# Patient Record
Sex: Male | Born: 1960 | Race: White | Hispanic: No | Marital: Single
Health system: Southern US, Community
[De-identification: ages and names within clinical notes are randomized; demographics above are authoritative.]

---

## 2005-12-02 ENCOUNTER — Emergency Department (HOSPITAL_COMMUNITY): Admission: EM | Admit: 2005-12-02 | Discharge: 2005-12-03 | Payer: Self-pay | Admitting: Emergency Medicine

## 2007-07-11 ENCOUNTER — Ambulatory Visit: Payer: Self-pay

## 2007-08-14 ENCOUNTER — Other Ambulatory Visit: Payer: Self-pay

## 2007-08-14 ENCOUNTER — Ambulatory Visit: Payer: Self-pay | Admitting: Urology

## 2008-02-01 ENCOUNTER — Emergency Department: Payer: Self-pay | Admitting: Emergency Medicine

## 2008-03-13 ENCOUNTER — Emergency Department: Payer: Self-pay | Admitting: Emergency Medicine

## 2008-03-17 ENCOUNTER — Inpatient Hospital Stay: Payer: Self-pay | Admitting: Internal Medicine

## 2008-03-30 ENCOUNTER — Emergency Department: Payer: Self-pay | Admitting: Emergency Medicine

## 2008-04-12 ENCOUNTER — Emergency Department: Payer: Self-pay | Admitting: Emergency Medicine

## 2008-05-12 ENCOUNTER — Ambulatory Visit: Payer: Self-pay

## 2008-05-13 ENCOUNTER — Emergency Department: Payer: Self-pay | Admitting: Emergency Medicine

## 2008-10-27 ENCOUNTER — Ambulatory Visit: Payer: Self-pay

## 2010-04-23 ENCOUNTER — Ambulatory Visit: Payer: Self-pay | Admitting: Internal Medicine

## 2010-05-10 ENCOUNTER — Inpatient Hospital Stay: Payer: Self-pay | Admitting: Internal Medicine

## 2010-05-24 ENCOUNTER — Ambulatory Visit: Payer: Self-pay | Admitting: Internal Medicine

## 2010-11-07 ENCOUNTER — Other Ambulatory Visit: Payer: Self-pay | Admitting: Geriatric Medicine

## 2010-12-05 ENCOUNTER — Other Ambulatory Visit: Payer: Self-pay | Admitting: Geriatric Medicine

## 2010-12-21 ENCOUNTER — Inpatient Hospital Stay: Payer: Self-pay | Admitting: Internal Medicine

## 2011-02-14 ENCOUNTER — Other Ambulatory Visit: Payer: Self-pay | Admitting: Geriatric Medicine

## 2011-12-24 IMAGING — CT CT HEAD WITHOUT CONTRAST
3 of 4 series · 17 of 37 positions shown, 19 images · non-contrast
Comparison: none

REASON FOR EXAM: ams
COMMENTS:   May transport without cardiac monitor

PROCEDURE:     CT  - CT HEAD WITHOUT CONTRAST  - December 21, 2010  [DATE]
RESULT:     Head CT dated 12/21/2010.
Study was compared to previous study dated 03/13/2008.
TECHNIQUE: Helical noncontrasted 5 mm sections were obtained from skull base
to the vertex.

[Series 3: soft tissue · axial · 0.47mm/px · z∈[-218,-98]mm · 6 of 34 slices shown, 8 images]
[im 5/34  brain]
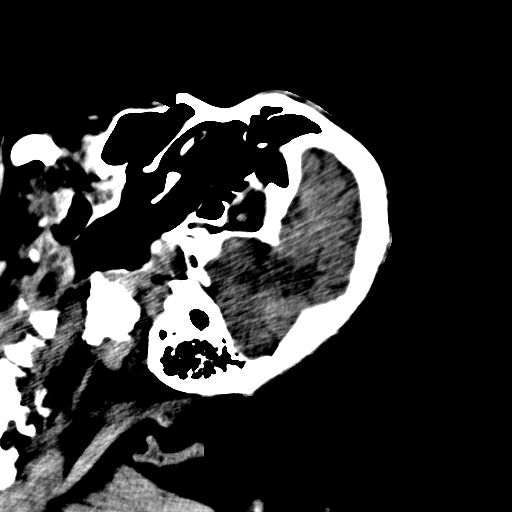
[im 5/34  bone]
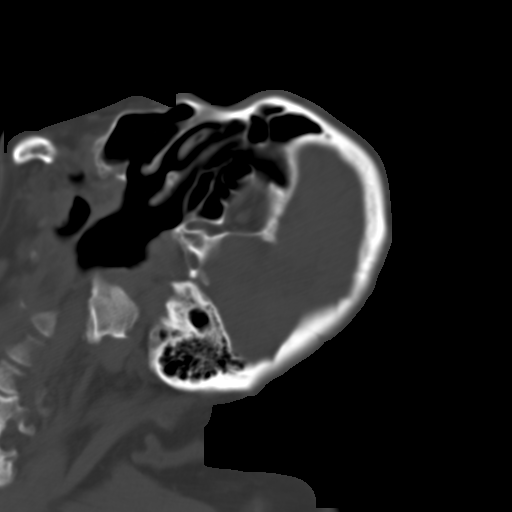
[im 10/34  brain]
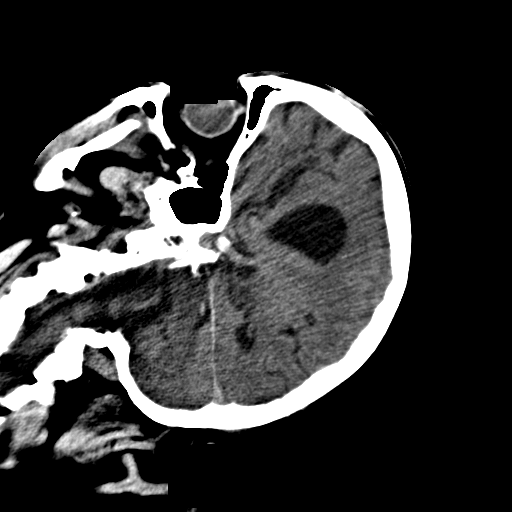
[im 15/34  brain]
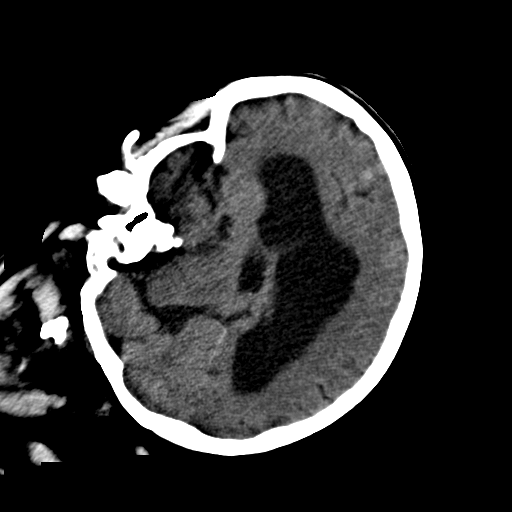
[im 19/34  brain]
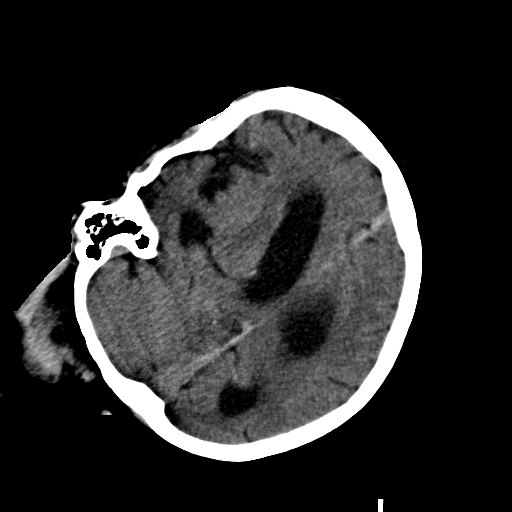
[im 24/34  brain]
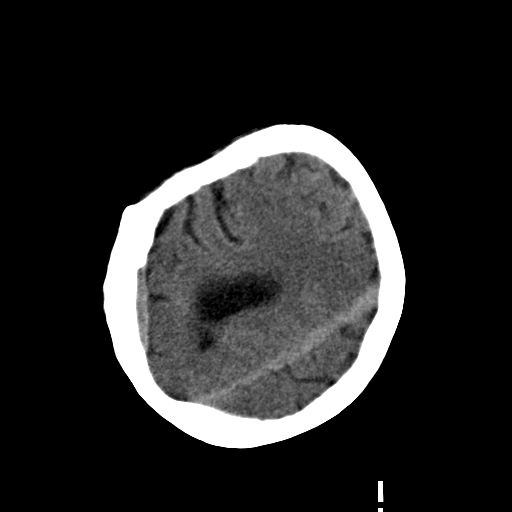
[im 24/34  bone]
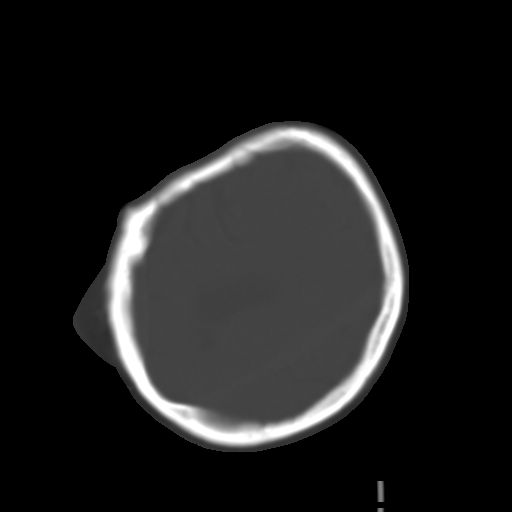
[im 29/34  brain]
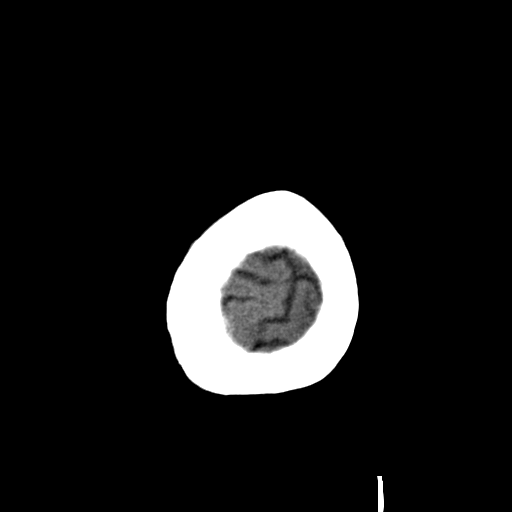

[Series 10: bone recon thin · axial · 0.47mm/px · z∈[-232,-112]mm · 8 of 100 slices shown]
[im 10/100  bone]
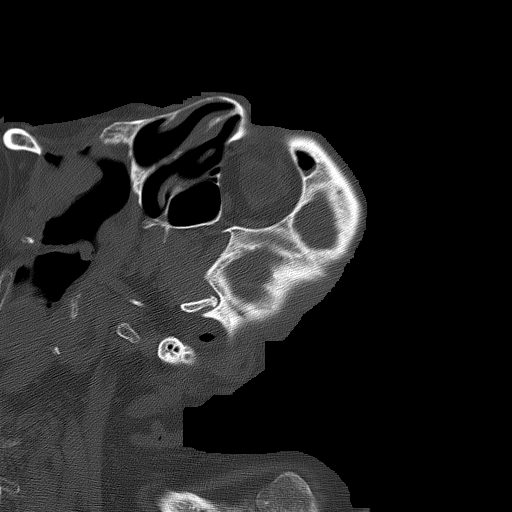
[im 19/100  bone]
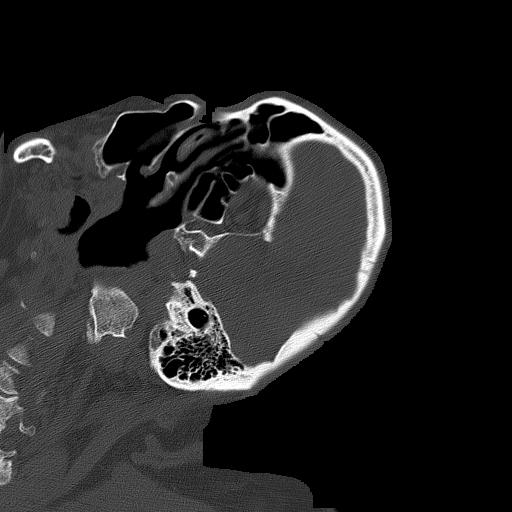
[im 34/100  bone]
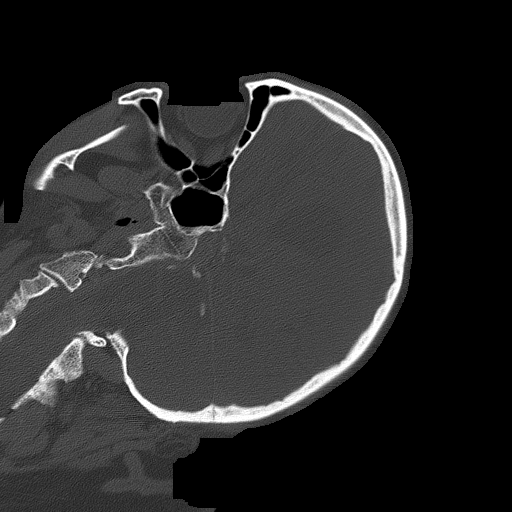
[im 43/100  bone]
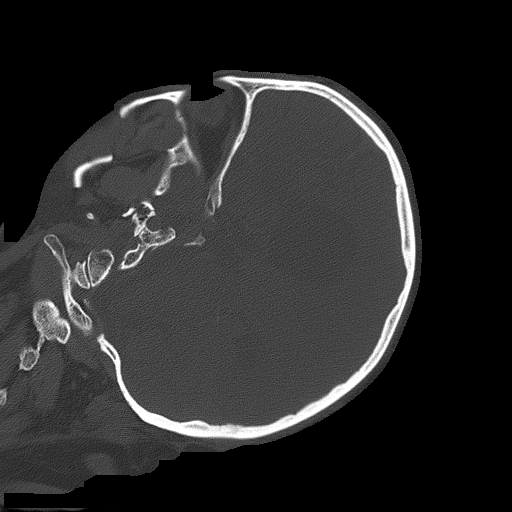
[im 57/100  bone]
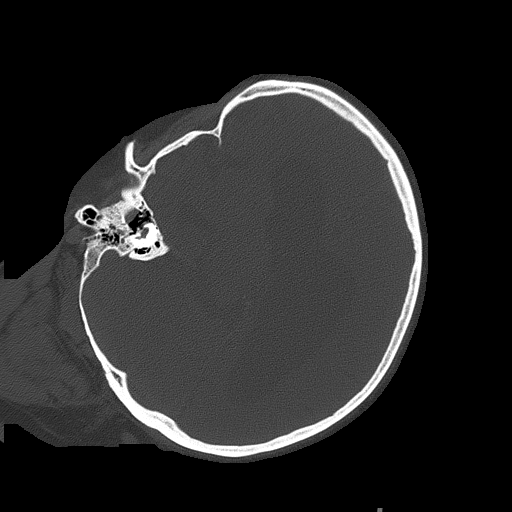
[im 67/100  bone]
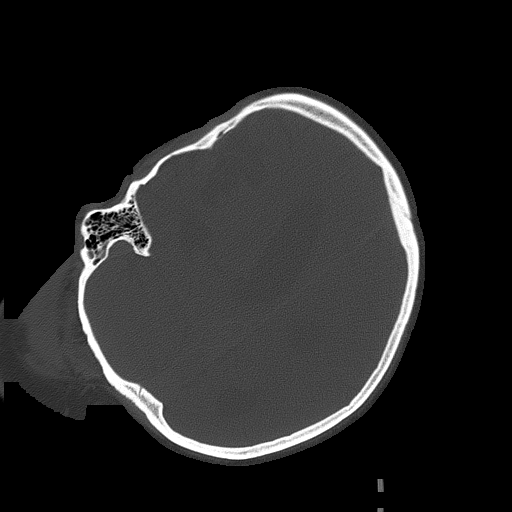
[im 81/100  bone]
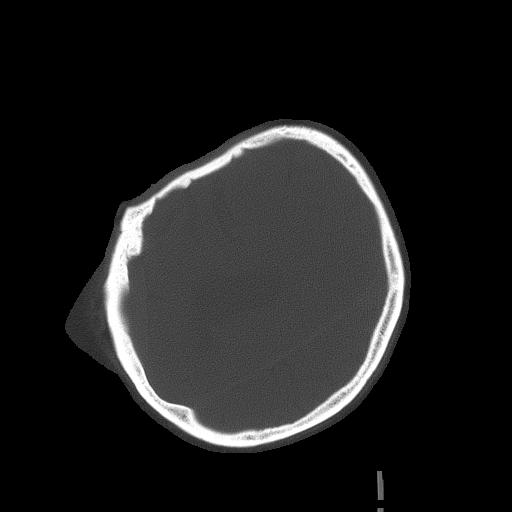
[im 90/100  bone]
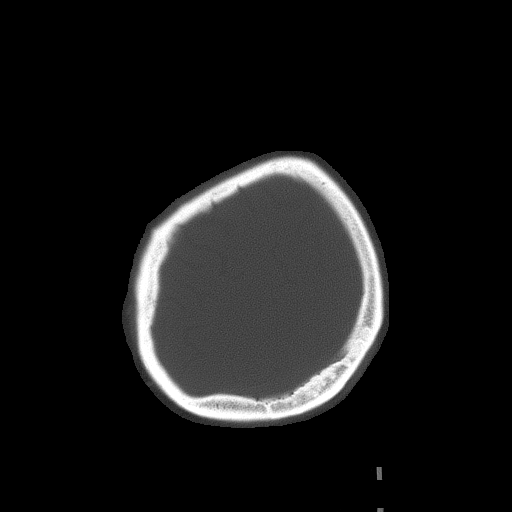

[Series 603: axial recon from thins · sagittal · 0.47mm/px · 3 of 45 slices shown]
[im 20/45  brain]
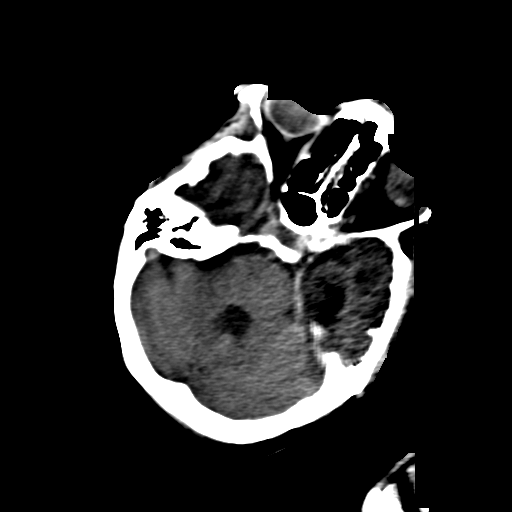
[im 22/45  brain]
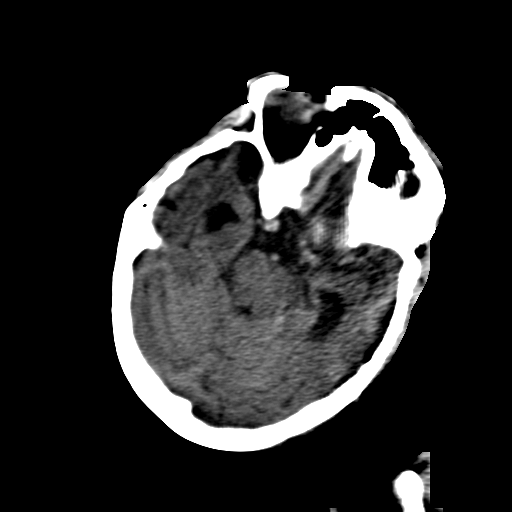
[im 25/45  brain]
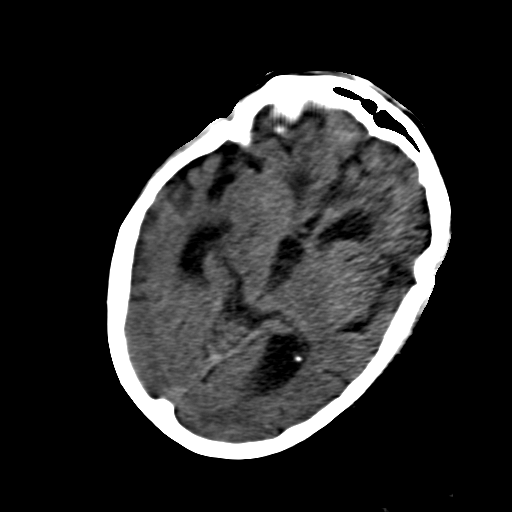

[17 of 37 positions shown; findings below may reference images not displayed]

FINDINGS: There is no evidence of intra-axial nor extra-axial fluid
collections nor evidence of acute hemorrhage.

There is no evidence of subfalcine or tonsillar herniation. Ventricles and
cisterns are patent. There is no evidence of depressed skull fracture. Mild
areas of low attenuation project within the subcortical, deep, and
periventricular white matter regions. Stable ventricular prominence is
identified.
IMPRESSION: Chronic changes without evidence of acute abnormalities.

## 2011-12-24 IMAGING — CR DG CHEST 1V PORT
1 series · 1 of 1 positions shown · non-contrast
Comparison: none

REASON FOR EXAM: ams
COMMENTS:

[view not recorded]
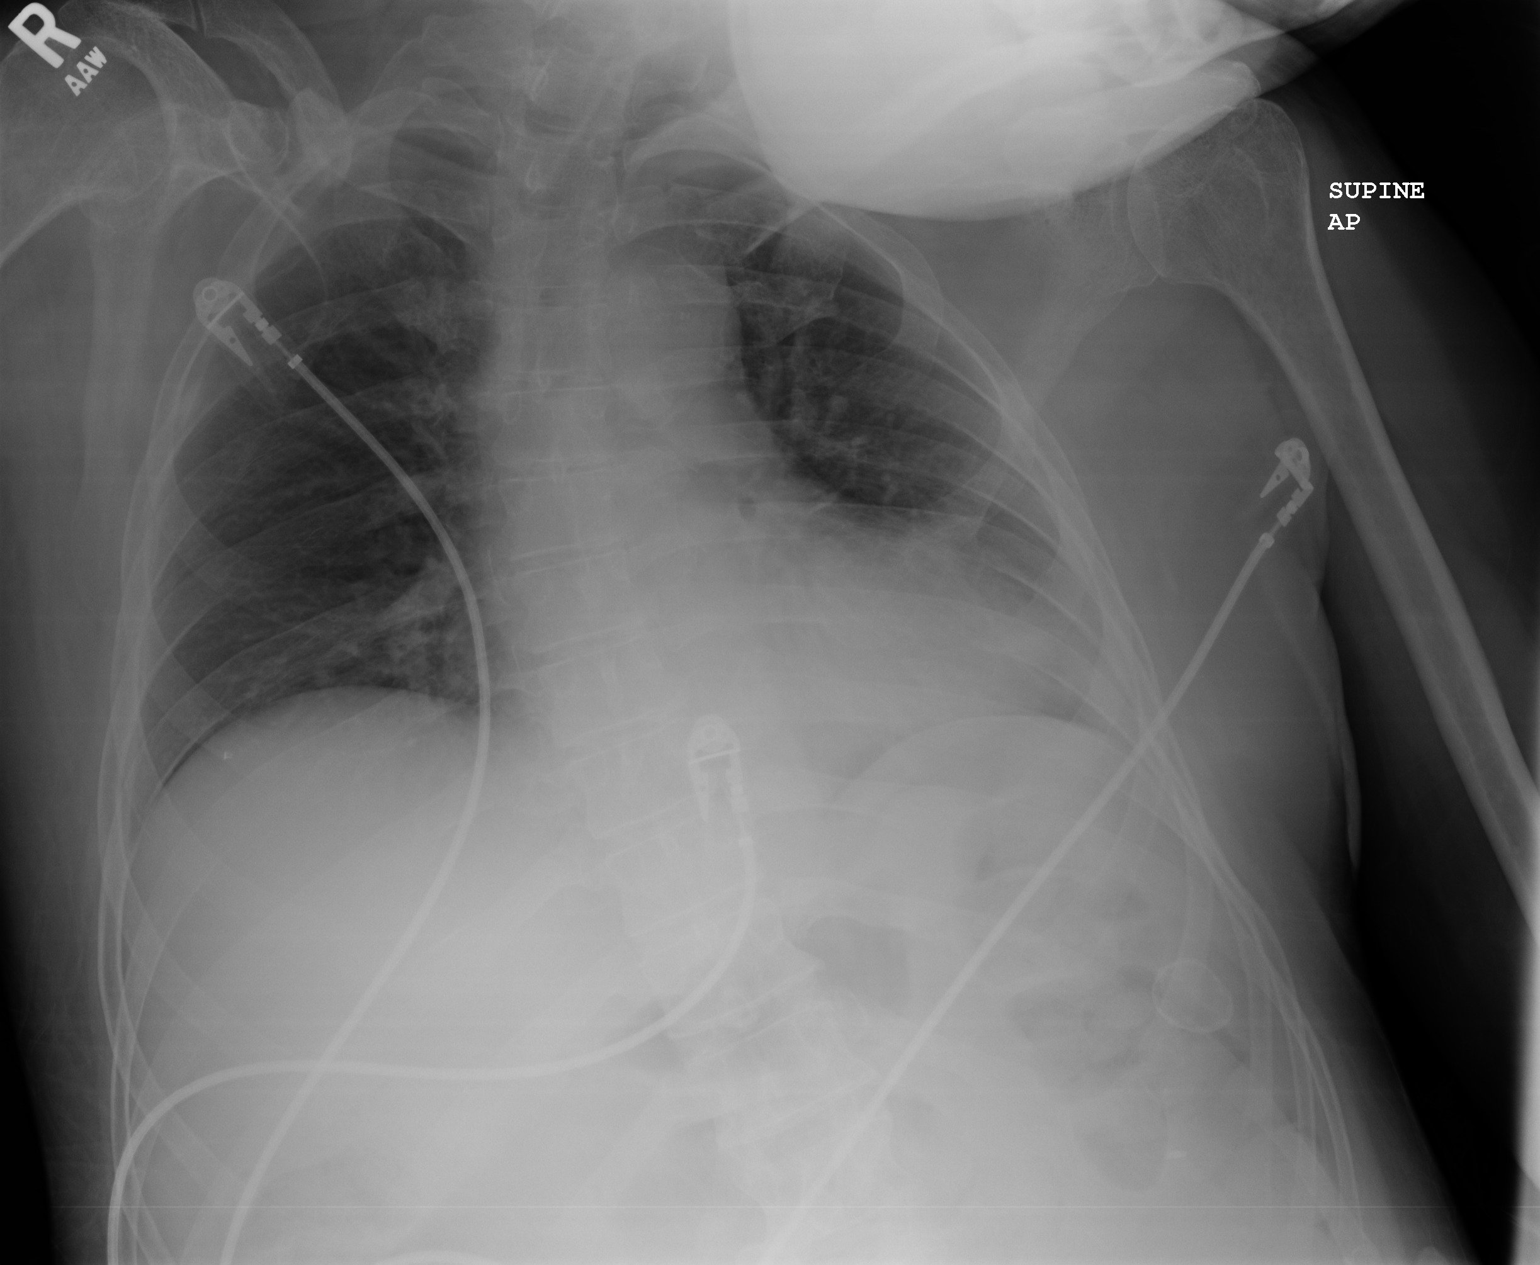

[1 of 1 positions shown; findings below may reference images not displayed]

PROCEDURE:     DXR - DXR PORTABLE CHEST SINGLE VIEW  - December 21, 2010  [DATE]

RESULT:     Comparison is made to prior study dated 05/12/2010.

The patient has taken a shallow inspiration. There is mild prominence of the
interstitial markings. Mild area of increased density projects in the right
and left lung bases. The cardiac silhouette is moderately enlarged. The
visualized bony skeleton is unremarkable.
IMPRESSION: 1. Shallow inspiration.
2. Pulmonary vascular congestion/mild edema.
3. Atelectasis versus infiltrate left lung base.

## 2012-05-08 ENCOUNTER — Other Ambulatory Visit: Payer: Self-pay | Admitting: Geriatric Medicine

## 2012-05-08 LAB — URINALYSIS, COMPLETE
Glucose,UR: 50 mg/dL (ref 0–75)
Nitrite: NEGATIVE
Ph: 6 (ref 4.5–8.0)
Specific Gravity: 1.023 (ref 1.003–1.030)
Squamous Epithelial: NONE SEEN

## 2012-05-08 LAB — CBC WITH DIFFERENTIAL/PLATELET
Basophil %: 0.1 %
Eosinophil #: 0 10*3/uL (ref 0.0–0.7)
HCT: 43.4 % (ref 40.0–52.0)
HGB: 14.2 g/dL (ref 13.0–18.0)
Lymphocyte #: 0.7 10*3/uL — ABNORMAL LOW (ref 1.0–3.6)
MCH: 32.1 pg (ref 26.0–34.0)
MCHC: 32.6 g/dL (ref 32.0–36.0)
MCV: 99 fL (ref 80–100)
Monocyte #: 0.4 x10 3/mm (ref 0.2–1.0)
Neutrophil #: 2 10*3/uL (ref 1.4–6.5)
WBC: 3.1 10*3/uL — ABNORMAL LOW (ref 3.8–10.6)

## 2012-05-24 DEATH — deceased

## 2019-01-05 ENCOUNTER — Telehealth: Payer: Self-pay | Admitting: General Practice

## 2019-01-05 NOTE — Telephone Encounter (Signed)
Patient aware to go to urgent care for med refill unable to fill patient medication until seen in the office.  Patient states he will call back to make an appt
